# Patient Record
Sex: Male | Born: 1970 | Race: White | Hispanic: No | Smoking: Never smoker
Health system: Southern US, Community
[De-identification: ages and names within clinical notes are randomized; demographics above are authoritative.]

## PROBLEM LIST (undated history)

## (undated) DIAGNOSIS — I10 Essential (primary) hypertension: Secondary | ICD-10-CM

## (undated) DIAGNOSIS — E785 Hyperlipidemia, unspecified: Secondary | ICD-10-CM

## (undated) HISTORY — DX: Essential (primary) hypertension: I10

## (undated) HISTORY — PX: NASAL SINUS SURGERY: SHX719

## (undated) HISTORY — DX: Hyperlipidemia, unspecified: E78.5

---

## 2001-09-29 ENCOUNTER — Encounter: Payer: Self-pay | Admitting: Cardiovascular Disease

## 2006-04-12 ENCOUNTER — Encounter: Payer: Self-pay | Admitting: Cardiovascular Disease

## 2006-07-17 ENCOUNTER — Ambulatory Visit: Payer: Self-pay | Admitting: Internal Medicine

## 2008-01-14 ENCOUNTER — Encounter: Payer: Self-pay | Admitting: Cardiovascular Disease

## 2008-11-10 ENCOUNTER — Emergency Department: Payer: Self-pay

## 2009-06-24 ENCOUNTER — Ambulatory Visit: Payer: Self-pay | Admitting: Unknown Physician Specialty

## 2009-08-05 ENCOUNTER — Ambulatory Visit: Payer: Self-pay | Admitting: Cardiovascular Disease

## 2009-08-07 DIAGNOSIS — I10 Essential (primary) hypertension: Secondary | ICD-10-CM

## 2009-08-10 ENCOUNTER — Ambulatory Visit: Payer: Self-pay | Admitting: Cardiovascular Disease

## 2009-08-10 DIAGNOSIS — E785 Hyperlipidemia, unspecified: Secondary | ICD-10-CM | POA: Insufficient documentation

## 2009-08-17 LAB — CONVERTED CEMR LAB
HDL: 39 mg/dL — ABNORMAL LOW (ref >39)
Total CHOL/HDL Ratio: 6.3
Triglycerides: 200 mg/dL — ABNORMAL HIGH (ref <150)
VLDL: 40 mg/dL (ref 0–40)

## 2010-01-10 ENCOUNTER — Ambulatory Visit: Payer: Self-pay | Admitting: Cardiovascular Disease

## 2010-01-10 DIAGNOSIS — I471 Supraventricular tachycardia: Secondary | ICD-10-CM

## 2010-05-03 NOTE — Assessment & Plan Note (Signed)
Summary: np6   CC:  ROV; Rx Refill.  History of Present Illness: [age]-year-old male known to me from [facility] with a history of obstructive sleep apnea, hypertension, moderate LVH, mildly dilated aortic root with history of hyperlipidemia who presents to reestablish care.  He states that overall he has been doing well. He is out of his blood pressure medications and needs a refill. He denies any significant shortness of breath or lightheadedness, no chest pain. He was changed from Diovan to lisinopril HCTZ on his last visit and states he has tolerated this well.  Current Medications (verified): 1)  Lisinopril-Hydrochlorothiazide 20-12.5 Mg Tabs (Lisinopril-Hydrochlorothiazide) .... Take 1 By Mouth Once Daily 2)  Fexofenadine Hcl 180 Mg Tabs (Fexofenadine Hcl) .... Take 1 By Mouth Once Daily 3)  Aspirin 81 Mg Tbec (Aspirin) .... Take One Tablet By Mouth Daily 4)  Oxycodone-Acetaminophen 5-325 Mg Tabs (Oxycodone-Acetaminophen) .... As Needed 5)  Fluticasone Propionate 50 Mcg/act Susp (Fluticasone Propionate) .... Once Daily 6)  [facility] Saline Nasal Spray 0.65 % Soln (Saline) .... As Needed 7)  Ibuprofen 200 Mg Tabs (Ibuprofen) .... Take 1-2 By Mouth As Needed  Allergies (verified): No Known Drug Allergies  Review of Systems  The patient denies fever, weight loss, weight gain, vision loss, decreased hearing, hoarseness, chest pain, syncope, dyspnea on exertion, peripheral edema, prolonged cough, abdominal pain, incontinence, muscle weakness, depression, and enlarged lymph nodes.    Vital Signs:  Patient profile:   40 year old male Height:      71 inches Weight:      263 pounds BMI:     36.81 Pulse rate:   80 / minute BP sitting:   100 / 74  (left arm) Cuff size:   regular  Vitals Entered By: Stanton Kidney, EMT-P (Aug 05, 2009 2:10 PM)  Physical Exam  General:  Well-appearing gentleman in no apparent distress, HEENT exam is benign, or pharynx is clear, neck  is supple with no JVP or carotid bruits, heart sounds are regular with S1-S2 and no murmurs appreciated, lungs are clear to auscultation with no wheezes or rales, abdominal exam is benign, no significant lower extremity edema, neurologic exam is nonfocal, skin is warm and dry. Pulses are equal and symmetrical in his upper and lower extremities.    EKG  Procedure date:  08/05/2009  Findings:      normal sinus rhythm with rate of 80 beats per minute, no significant ST T-wave changes. Incomplete right bundle branch block.  Impression & Recommendations:  Problem # 1:  HYPERTENSION, BENIGN (ICD-401.1) blood pressure has been well-controlled on his current medication regimen and her lisinopril HCT 20/12.5 mg daily. We have given him some refills for the year.  He does have a history of obstructive sleep apnea, moderate LVH, mildly dilated aortic root. We can check his cardiac pathology including the aortic root with a routine echocardiogram in several years time.  His updated medication list for this problem includes:    Lisinopril-hydrochlorothiazide 20-12.5 Mg Tabs (Lisinopril-hydrochlorothiazide) .Marland Kitchen... Take 1 by mouth once daily    Aspirin 81 Mg Tbec (Aspirin) .Marland Kitchen... Take one tablet by mouth daily  Problem # 2:  PREVENTIVE HEALTH CARE (ICD-V70.0) We'll check a cholesterol in the next week. He believes his lipid numbers have been very elevated in the past.  He does have a very strong family history of coronary artery disease.  Patient Instructions: 1)  Your physician recommends that you schedule a follow-up appointment in: 1 year  2)  Your physician recommends that you return for a FASTING lipid profile: in 1 week (lipid)

## 2010-05-03 NOTE — Letter (Signed)
Summary: [facility]  [facility]   Imported By: Harlon Flor 08/08/2009 15:32:56  _____________________________________________________________________  External Attachment:    Type:   Image     Comment:   External Document

## 2010-05-03 NOTE — Letter (Signed)
Summary: Medical Record Release  Medical Record Release   Imported By: Harlon Flor 08/10/2009 14:47:54  _____________________________________________________________________  External Attachment:    Type:   Image     Comment:   External Document

## 2010-05-03 NOTE — Assessment & Plan Note (Signed)
Summary: Fluttering in chest & dizziness   Visit Type:  Follow-up Primary Provider:  None  CC:  c/o fluttering in chest with some dizziness and nausea and chest pain.Marland Kitchen  History of Present Illness: [age]-year-old male  with a history of obstructive sleep apnea, hypertension, moderate LVH, mildly dilated aortic root with history of hyperlipidemia who presents for follow up and with new symptoms of tachycardia.   he reports having acute onset of fluttering in his chest one week ago lasting for 30 seconds. It was very rapid associated with some lightheadedness. It occurred in the early evening.  He had a second episode the next day lasting for 20-30 seconds associated with dizziness and nausea. This occurred in the morning.  He has had no further episodes since that time. 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 He denies any other ailments or new symptoms and has been in good health.  EKG shows normal sinus rhythm with rate 79 beats per minute, no significant ST or T wave changes.  Preventive Screening-Counseling & Management  Alcohol-Tobacco     Smoking Status: never  Caffeine-Diet-Exercise     Does Patient Exercise: no      Drug Use:  no.    Current Medications (verified): 1)  Lisinopril-Hydrochlorothiazide 20-12.5 Mg Tabs (Lisinopril-Hydrochlorothiazide) .... Take 1 By Mouth Once Daily 2)  Fexofenadine Hcl 180 Mg Tabs (Fexofenadine Hcl) .... Take 1 By Mouth Once Daily 3)  Aspirin 81 Mg Tbec (Aspirin) .... Take One Tablet By Mouth Daily 4)  Oxycodone-Acetaminophen 5-325 Mg Tabs (Oxycodone-Acetaminophen) .... As Needed 5)  Fluticasone Propionate 50 Mcg/act Susp (Fluticasone Propionate) .... Once Daily 6)  [facility] Saline Nasal Spray 0.65 % Soln (Saline) .... As Needed 7)  Ibuprofen 200 Mg Tabs (Ibuprofen) .... Take 1-2 By Mouth As Needed 8)  Simvastatin 20 Mg Tabs (Simvastatin) .... Take One Tablet By Mouth Daily At Bedtime  Allergies (verified): No Known Drug Allergies  Past  History:  Family History: Last updated: Jan 28, 2010 Father: Deceased MI age 102 Mother: Living; CAD, Diabetic, Hypertension, hyperlipidemia Siblings: Good health  Social History: Last updated: 28-Jan-2010 Single  Full Time Tobacco Use - No.  Alcohol Use - yes--occas. Regular Exercise - no Drug Use - no  Risk Factors: Exercise: no (01-28-2010)  Risk Factors: Smoking Status: never (Jan 28, 2010)  Past Medical History: Hyperlipidemia Hypertension  Past Surgical History: Sinus surgery  Family History: Father: Deceased MI age 102 Mother: Living; CAD, Diabetic, Hypertension, hyperlipidemia Siblings: Good health  Social History: Single  Full Time Tobacco Use - No.  Alcohol Use - yes--occas. Regular Exercise - no Drug Use - no Smoking Status:  never Does Patient Exercise:  no Drug Use:  no  Review of Systems  The patient denies fever, weight loss, weight gain, vision loss, decreased hearing, hoarseness, chest pain, syncope, dyspnea on exertion, peripheral edema, prolonged cough, abdominal pain, incontinence, muscle weakness, depression, and enlarged lymph nodes.         Tachycardia, nausea  Vital Signs:  Patient profile:   40 year old male Height:      71 inches Weight:      266 pounds BMI:     37.23 Pulse rate:   89 / minute BP sitting:   120 / 84  (left arm) Cuff size:   large  Vitals Entered By: Bishop Dublin, CMA (01/28/2010 11:12 AM)  Physical Exam  General:  Well developed, well nourished, in no acute distress. Head:  normocephalic and atraumatic Neck:  Neck supple, no  JVD. No masses, thyromegaly or abnormal cervical nodes. Lungs:  Clear bilaterally to auscultation and percussion. Heart:  Non-displaced PMI, chest non-tender; regular rate and rhythm, S1, S2 without murmurs, rubs or gallops. Carotid upstroke normal, no bruit. NPedals normal pulses. No edema, no varicosities. Msk:  Back normal, normal gait. Muscle strength and tone normal. Pulses:   pulses normal in all 4 extremities Extremities:  No clubbing or cyanosis. Neurologic:  Alert and oriented x 3. Skin:  Intact without lesions or rashes. Psych:  Normal affect.   Impression & Recommendations:  Problem # 1:  TACHYCARDIA (ICD-785) 2 episodes of tachycardia last week that were self-limiting. This is concerning for SVT or an atrial tachycardia. We have discussed this with him and will make a medication change. We will hold his lisinopril and start him on diltiazem CD 120 mg daily. We will have him monitor his blood pressure and heart rate. If he has additional episodes, we could try a Holter monitor though I suspect his symptoms are infrequent enough that this may come up negative. He is [employer] which typically does not cover event monitors.  For additional episodes, we could add low dose beta blocker  We could also increase the dose of his diltiazem.  Problem # 2:  HYPERLIPIDEMIA (ICD-272.4) we'll have him check his cholesterol sometime this week or next week and adjust his medications accordingly.  His updated medication list for this problem includes:    Simvastatin 20 Mg Tabs (Simvastatin) .Marland Kitchen... Take one tablet by mouth daily at bedtime  Problem # 3:  HYPERTENSION, BENIGN (ICD-401.1) We'll have him monitor his blood pressure on diltiazem.  The following medications were removed from the medication list:    Lisinopril-hydrochlorothiazide 20-12.5 Mg Tabs (Lisinopril-hydrochlorothiazide) .Marland Kitchen... Take 1 by mouth once daily His updated medication list for this problem includes:    Aspirin 81 Mg Tbec (Aspirin) .Marland Kitchen... Take one tablet by mouth daily    Diltiazem Hcl Er Beads 120 Mg Xr24h-cap (Diltiazem hcl er beads) .Marland Kitchen... Take one capsule by mouth daily  Patient Instructions: 1)  Your physician recommends that you return for a FASTING lipid profile: LIP/LFT 2)  Your physician has recommended you make the following change in your medication: STOP lisinopril  START diltiazem 3)  Your physician wants you to follow-up in:  6 months  You will receive a reminder letter in the mail two months in advance. If you don't receive a letter, please call our office to schedule the follow-up appointment. Prescriptions: DILTIAZEM HCL ER BEADS 120 MG XR24H-CAP (DILTIAZEM HCL ER BEADS) Take one capsule by mouth daily  #30 x 6   Entered by:   Benedict Needy, RN   Authorized by:   Dossie Arbour MD   Signed by:   Benedict Needy, RN on 01/10/2010   Method used:   [facility] to        [facility] [phone]* (retail)       [address]       [place], [place]  [address]       Ph: [phone]       Fax: [phone]   RxID:   [id number]

## 2010-05-03 NOTE — Letter (Signed)
Summary: PHI  PHI   Imported By: Harlon Flor 08/08/2009 15:35:15  _____________________________________________________________________  External Attachment:    Type:   Image     Comment:   External Document

## 2010-08-15 ENCOUNTER — Other Ambulatory Visit: Payer: Self-pay | Admitting: Emergency Medicine

## 2010-08-15 MED ORDER — SIMVASTATIN 20 MG PO TABS: 20.0000 mg | ORAL_TABLET | Freq: Every day | ORAL | Status: DC

## 2010-08-29 ENCOUNTER — Other Ambulatory Visit: Payer: Self-pay | Admitting: Emergency Medicine

## 2010-08-29 MED ORDER — SIMVASTATIN 20 MG PO TABS: 20.0000 mg | ORAL_TABLET | Freq: Every day | ORAL | Status: DC

## 2010-09-07 ENCOUNTER — Encounter: Payer: Self-pay | Admitting: Cardiovascular Disease

## 2010-09-07 ENCOUNTER — Ambulatory Visit (INDEPENDENT_AMBULATORY_CARE_PROVIDER_SITE_OTHER): Payer: BC Managed Care – PPO | Admitting: Cardiovascular Disease

## 2010-09-07 DIAGNOSIS — E785 Hyperlipidemia, unspecified: Secondary | ICD-10-CM

## 2010-09-07 DIAGNOSIS — I10 Essential (primary) hypertension: Secondary | ICD-10-CM

## 2010-09-07 DIAGNOSIS — R0989 Other specified symptoms and signs involving the circulatory and respiratory systems: Secondary | ICD-10-CM

## 2010-09-07 MED ORDER — DILTIAZEM HCL 120 MG PO CP24: 120.0000 mg | ORAL_CAPSULE | Freq: Every day | ORAL | Status: DC

## 2010-09-07 NOTE — Patient Instructions (Signed)
Your physician recommends that you return for a FASTING lipid profile: 09/11/10 @ 8:30AM Your physician recommends that you schedule a follow-up appointment in: 1 year

## 2010-09-07 NOTE — Assessment & Plan Note (Signed)
Blood pressure is well-controlled on a diltiazem. No changes made.

## 2010-09-07 NOTE — Assessment & Plan Note (Signed)
We have suggested he check his cholesterol at his convenience. If he needs further adjustment, we could start Lipitor 40 mg for more aggressive control.

## 2010-09-07 NOTE — Progress Notes (Signed)
   Patient ID: Donald Holder, male    DOB: [birth year], 40 y.o.   MRN: [id number]  HPI Comments: 40 year old male  with a history of obstructive sleep apnea, hypertension, moderate LVH, mildly dilated aortic root with history of hyperlipidemia, With the evaluation last year for what sounded like SVT, started on diltiazem for now presents for follow up   Since he has been on a diltiazem 120 mg long-acting dose he has felt much better. He has had rare episodes of palpitations though these are very short, come on at night time. He denies any significant palpitations during the daytime. He has been taking his cholesterol medication on a regular basis.  He has had no further episodes since that time. 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 He denies any other ailments or new symptoms and has been in good health.   EKG shows normal sinus rhythm with rate 86 beats per minute, no significant ST or T wave changes.      Review of Systems  Constitutional: Negative.   HENT: Negative.   Eyes: Negative.   Respiratory: Negative.   Cardiovascular: Positive for palpitations.  Gastrointestinal: Negative.   Musculoskeletal: Negative.   Skin: Negative.   Neurological: Negative.   Hematological: Negative.   Psychiatric/Behavioral: Negative.   All other systems reviewed and are negative.    BP 118/68  Pulse 86  Ht 5\' 11"  (1.803 m)  Wt 257 lb 1.9 oz (116.629 kg)  BMI 35.86 kg/m2   Physical Exam  Nursing note and vitals reviewed. Constitutional: He is oriented to person, place, and time. He appears well-developed and well-nourished.  HENT:  Head: Normocephalic.  Nose: Nose normal.  Mouth/Throat: Oropharynx is clear and moist.  Eyes: Conjunctivae are normal. Pupils are equal, round, and reactive to light.  Neck: Normal range of motion. Neck supple. No JVD present.  Cardiovascular: Normal rate, regular rhythm, S1 normal, S2 normal, normal heart sounds and intact distal pulses.  Exam  reveals no gallop and no friction rub.   No murmur heard. Pulmonary/Chest: Effort normal and breath sounds normal. No respiratory distress. He has no wheezes. He has no rales. He exhibits no tenderness.  Abdominal: Soft. Bowel sounds are normal. He exhibits no distension. There is no tenderness.  Musculoskeletal: Normal range of motion. He exhibits no edema and no tenderness.  Lymphadenopathy:    He has no cervical adenopathy.  Neurological: He is alert and oriented to person, place, and time. Coordination normal.  Skin: Skin is warm and dry. No rash noted. No erythema.  Psychiatric: He has a normal mood and affect. His behavior is normal. Judgment and thought content normal.           Assessment and Plan

## 2010-09-07 NOTE — Assessment & Plan Note (Signed)
No further episodes of tachycardia, believed to be SVT. Doing well on diltiazem. We did offer short-acting diltiazem for breakthrough. He does not believe that he needs it at this time.

## 2010-09-11 ENCOUNTER — Other Ambulatory Visit: Payer: BC Managed Care – PPO | Admitting: *Deleted

## 2010-09-18 ENCOUNTER — Other Ambulatory Visit (INDEPENDENT_AMBULATORY_CARE_PROVIDER_SITE_OTHER): Payer: BC Managed Care – PPO | Admitting: *Deleted

## 2010-09-18 DIAGNOSIS — E785 Hyperlipidemia, unspecified: Secondary | ICD-10-CM

## 2010-09-18 LAB — HEPATIC FUNCTION PANEL
AST: 25 U/L (ref 0–37)
Albumin: 4.3 g/dL (ref 3.5–5.2)
Alkaline Phosphatase: 70 U/L (ref 39–117)
Bilirubin, Direct: 0.1 mg/dL (ref 0.0–0.3)
Total Bilirubin: 0.6 mg/dL (ref 0.3–1.2)

## 2010-09-18 LAB — LIPID PANEL: LDL Cholesterol: 74 mg/dL (ref 0–99)

## 2010-10-04 ENCOUNTER — Encounter: Payer: Self-pay | Admitting: Cardiovascular Disease

## 2010-10-05 ENCOUNTER — Other Ambulatory Visit: Payer: Self-pay | Admitting: Cardiovascular Disease

## 2010-10-05 MED ORDER — SIMVASTATIN 20 MG PO TABS: 20.0000 mg | ORAL_TABLET | Freq: Every day | ORAL | Status: DC

## 2010-11-20 ENCOUNTER — Ambulatory Visit: Payer: Self-pay

## 2010-11-24 ENCOUNTER — Ambulatory Visit: Payer: Self-pay | Admitting: Internal Medicine

## 2011-10-26 ENCOUNTER — Encounter: Payer: Self-pay | Admitting: Cardiovascular Disease

## 2011-10-26 ENCOUNTER — Ambulatory Visit (INDEPENDENT_AMBULATORY_CARE_PROVIDER_SITE_OTHER): Payer: BC Managed Care – PPO | Admitting: Cardiovascular Disease

## 2011-10-26 VITALS — BP 124/94 | HR 78 | Ht 71.0 in | Wt 259.5 lb

## 2011-10-26 DIAGNOSIS — I499 Cardiac arrhythmia, unspecified: Secondary | ICD-10-CM

## 2011-10-26 DIAGNOSIS — E785 Hyperlipidemia, unspecified: Secondary | ICD-10-CM

## 2011-10-26 DIAGNOSIS — I471 Supraventricular tachycardia, unspecified: Secondary | ICD-10-CM

## 2011-10-26 DIAGNOSIS — I10 Essential (primary) hypertension: Secondary | ICD-10-CM

## 2011-10-26 DIAGNOSIS — I498 Other specified cardiac arrhythmias: Secondary | ICD-10-CM

## 2011-10-26 MED ORDER — DILTIAZEM HCL ER 120 MG PO CP24: 120.0000 mg | ORAL_CAPSULE | Freq: Every day | ORAL | Status: DC

## 2011-10-26 MED ORDER — SIMVASTATIN 20 MG PO TABS: 20.0000 mg | ORAL_TABLET | Freq: Every day | ORAL | Status: DC

## 2011-10-26 NOTE — Progress Notes (Signed)
Patient ID: Donald Holder, male    DOB: September 28, 1970, 41 y.o.   MRN: [id number]  HPI Comments: 41 year old male  with a history of obstructive sleep apnea, hypertension, moderate LVH, mildly dilated aortic root with history of hyperlipidemia, With the evaluation last year for what sounded like SVT, started on diltiazem, presenting for follow up.  He was previously doing well on diltiazem 120 mg daily. He reports that his blood pressure was mildly elevated when he saw Dr. Beverely Risen. The diltiazem dose was increased to 180 mg daily. He was told to stop aspirin. Since the medication changes were made, he has not felt as well. He has more fatigue. He reports that his blood pressure prior to the medication change was in the 120 range systolic, mid 80s or lower diastolic. He reports it has been a stressful period for him at work and blood pressure could be higher secondary to this. He is requesting that we decrease the dose of diltiazem back to 120 mg daily.  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 He denies any other ailments or new symptoms and has been in good health.   EKG shows normal sinus rhythm with rate 78 beats per minute, no significant ST or T wave changes.    Outpatient Encounter Prescriptions as of 10/26/2011  Medication Sig Dispense Refill  . diltiazem (DILACOR XR) 120 MG 24 hr capsule Take 1 capsule (120 mg total) by mouth daily.  90 capsule  3  . fexofenadine (ALLEGRA) 180 MG tablet Take 180 mg by mouth daily.        . fluticasone (FLONASE) 50 MCG/ACT nasal spray Place 2 sprays into the nose daily.        Marland Kitchen ibuprofen (ADVIL,MOTRIN) 200 MG tablet Take 200 mg by mouth every 6 (six) hours as needed.        . simvastatin (ZOCOR) 20 MG tablet Take 1 tablet (20 mg total) by mouth at bedtime.  90 tablet  4  . diltiazem (DILACOR XR) 180 MG 24 hr capsule Take 180 mg by mouth daily.      Marland Kitchen DISCONTD: aspirin 81 MG EC tablet Take 81 mg by mouth daily.         Marland Kitchen DISCONTD: diltiazem (DILACOR XR) 120 MG 24 hr capsule Take 1 capsule (120 mg total) by mouth daily.  90 capsule  4    Review of Systems  Constitutional: Negative.   HENT: Negative.   Eyes: Negative.   Respiratory: Negative.   Gastrointestinal: Negative.   Musculoskeletal: Negative.   Skin: Negative.   Neurological: Negative.   Hematological: Negative.   Psychiatric/Behavioral: Negative.   All other systems reviewed and are negative.    BP 124/94  Pulse 78  Ht 5\' 11"  (1.803 m)  Wt 259 lb 8 oz (117.708 kg)  BMI 36.19 kg/m2  Physical Exam  Nursing note and vitals reviewed. Constitutional: He is oriented to person, place, and time. He appears well-developed and well-nourished.  HENT:  Head: Normocephalic.  Nose: Nose normal.  Mouth/Throat: Oropharynx is clear and moist.  Eyes: Conjunctivae are normal. Pupils are equal, round, and reactive to light.  Neck: Normal range of motion. Neck supple. No JVD present.  Cardiovascular: Normal rate, regular rhythm, S1 normal, S2 normal, normal heart sounds and intact distal pulses.  Exam reveals no gallop and no friction rub.   No murmur heard. Pulmonary/Chest: Effort normal and breath sounds normal. No respiratory distress. He has no wheezes. He  has no rales. He exhibits no tenderness.  Abdominal: Soft. Bowel sounds are normal. He exhibits no distension. There is no tenderness.  Musculoskeletal: Normal range of motion. He exhibits no edema and no tenderness.  Lymphadenopathy:    He has no cervical adenopathy.  Neurological: He is alert and oriented to person, place, and time. Coordination normal.  Skin: Skin is warm and dry. No rash noted. No erythema.  Psychiatric: He has a normal mood and affect. His behavior is normal. Judgment and thought content normal.           Assessment and Plan

## 2011-10-26 NOTE — Assessment & Plan Note (Signed)
Cholesterol is at goal on the current lipid regimen. No changes to the medications were made.  

## 2011-10-26 NOTE — Assessment & Plan Note (Signed)
No further episodes of tachycardia. No further workup at this time 

## 2011-10-26 NOTE — Assessment & Plan Note (Signed)
He has requested to decrease the diltiazem back to 120 mg daily. We will do this for him and have suggested he monitor his blood pressure at home and call our office for diastolic pressures consistently in the 90s. He does report having pressures in the 80s.

## 2011-10-26 NOTE — Patient Instructions (Addendum)
You are doing well. Please change the diltiazem back to 120 mg daily OK to take aspirin 81 mg once a day  Please call us if you have new issues that need to be addressed before your next appt.  Your physician wants you to follow-up in: 12 months.  You will receive a reminder letter in the mail two months in advance. If you don't receive a letter, please call our office to schedule the follow-up appointment.

## 2011-10-29 ENCOUNTER — Telehealth: Payer: Self-pay | Admitting: Cardiovascular Disease

## 2011-10-29 NOTE — Telephone Encounter (Signed)
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  The letter needs to go to [facility] fax number : [phone].  Please call the mother Velna Hatchet at [phone] when this is completed per her request.

## 2011-10-30 NOTE — Telephone Encounter (Signed)
Discussed with Dr. 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

## 2011-10-30 NOTE — Telephone Encounter (Signed)
Pt's mother, shelia, informed

## 2012-11-14 ENCOUNTER — Telehealth: Payer: Self-pay | Admitting: *Deleted

## 2012-11-14 NOTE — Telephone Encounter (Signed)
Lmom to schedule f/u appt w/ Dr. Mariah Milling

## 2012-11-20 ENCOUNTER — Other Ambulatory Visit: Payer: Self-pay | Admitting: Cardiovascular Disease

## 2012-11-28 ENCOUNTER — Other Ambulatory Visit: Payer: Self-pay | Admitting: *Deleted

## 2012-11-28 MED ORDER — DILTIAZEM HCL ER 120 MG PO CP24: 120.0000 mg | ORAL_CAPSULE | Freq: Every day | ORAL | Status: DC

## 2012-11-28 MED ORDER — SIMVASTATIN 20 MG PO TABS: 20.0000 mg | ORAL_TABLET | Freq: Every day | ORAL | Status: DC

## 2012-11-28 NOTE — Telephone Encounter (Signed)
Refilled Diltiazem #90 R#0.

## 2012-11-28 NOTE — Telephone Encounter (Signed)
[facility] Simvastatin #90 R#0. Lmtcb pt is overdue for 1 yr f/u needs to contact office to schedule future appointment.

## 2013-02-28 ENCOUNTER — Other Ambulatory Visit: Payer: Self-pay | Admitting: Cardiovascular Disease

## 2013-03-02 ENCOUNTER — Other Ambulatory Visit: Payer: Self-pay | Admitting: *Deleted

## 2013-03-02 MED ORDER — SIMVASTATIN 20 MG PO TABS: 20.0000 mg | ORAL_TABLET | Freq: Every day | ORAL | Status: DC

## 2013-03-02 NOTE — Telephone Encounter (Signed)
Requested Prescriptions   Signed Prescriptions Disp Refills  . simvastatin (ZOCOR) 20 MG tablet 30 tablet 0    Sig: Take 1 tablet (20 mg total) by mouth at bedtime.    Authorizing Provider: Antonieta Iba    Ordering User: Kendrick Fries   LMTCB pt is overdue for 1 yr follow up needs to schedule appointment with Dr. Mariah Milling.

## 2013-03-23 ENCOUNTER — Ambulatory Visit: Payer: BC Managed Care – PPO | Admitting: Cardiovascular Disease

## 2013-03-24 ENCOUNTER — Encounter: Payer: Self-pay | Admitting: Cardiovascular Disease

## 2013-03-24 ENCOUNTER — Ambulatory Visit (INDEPENDENT_AMBULATORY_CARE_PROVIDER_SITE_OTHER): Payer: BC Managed Care – PPO | Admitting: Cardiovascular Disease

## 2013-03-24 VITALS — BP 122/92 | HR 71 | Ht 71.0 in | Wt 274.0 lb

## 2013-03-24 DIAGNOSIS — I10 Essential (primary) hypertension: Secondary | ICD-10-CM

## 2013-03-24 DIAGNOSIS — R0602 Shortness of breath: Secondary | ICD-10-CM

## 2013-03-24 DIAGNOSIS — Z79899 Other long term (current) drug therapy: Secondary | ICD-10-CM

## 2013-03-24 DIAGNOSIS — E785 Hyperlipidemia, unspecified: Secondary | ICD-10-CM

## 2013-03-24 DIAGNOSIS — I498 Other specified cardiac arrhythmias: Secondary | ICD-10-CM

## 2013-03-24 DIAGNOSIS — I471 Supraventricular tachycardia: Secondary | ICD-10-CM

## 2013-03-24 MED ORDER — SIMVASTATIN 20 MG PO TABS: 20.0000 mg | ORAL_TABLET | Freq: Every day | ORAL | Status: DC

## 2013-03-24 MED ORDER — DILTIAZEM HCL ER 120 MG PO CP24: 120.0000 mg | ORAL_CAPSULE | Freq: Every day | ORAL | Status: DC

## 2013-03-24 NOTE — Patient Instructions (Signed)
You are doing well. No medication changes were made.  We will draw your blood today and call you with results  Please call us if you have new issues that need to be addressed before your next appt.  Your physician wants you to follow-up in: 12 months.  You will receive a reminder letter in the mail two months in advance. If you don't receive a letter, please call our office to schedule the follow-up appointment.

## 2013-03-24 NOTE — Assessment & Plan Note (Signed)
No further episodes of arrhythmia. Stay on diltiazem

## 2013-03-24 NOTE — Progress Notes (Signed)
Patient ID: Donald Holder, male    DOB: 1970-07-12, 42 y.o.   MRN: [id number]  HPI Comments: 80 -year-old male  with a history of obstructive sleep apnea, hypertension, moderate LVH, mildly dilated aortic root with history of hyperlipidemia, and previous tachycardia episodes concerning for SVT, started on diltiazem .  On his last clinic visit, he was changed to diltiazem 120 mg daily. On this dose, blood pressure has been well controlled with no further episodes of tachycardia . 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 He denies any other ailments or new symptoms and has been in good health. [place] [address] [place]    EKG shows normal sinus rhythm with rate 71 beats per minute, no significant ST or T wave changes.    Outpatient Encounter Prescriptions as of 03/24/2013  Medication Sig  . diltiazem (DILACOR XR) 120 MG 24 hr capsule Take 1 capsule (120 mg total) by mouth daily.  . fexofenadine (ALLEGRA) 180 MG tablet Take 180 mg by mouth daily.    . fluticasone (FLONASE) 50 MCG/ACT nasal spray Place 2 sprays into the nose daily.    Marland Kitchen ibuprofen (ADVIL,MOTRIN) 200 MG tablet Take 200 mg by mouth every 6 (six) hours as needed.    . simvastatin (ZOCOR) 20 MG tablet Take 1 tablet (20 mg total) by mouth at bedtime.  . simvastatin (ZOCOR) 20 MG tablet Take 1 tablet (20 mg total) by mouth daily at 6 PM.  . [DISCONTINUED] diltiazem (DILACOR XR) 120 MG 24 hr capsule Take 120 mg by mouth daily.  . [DISCONTINUED] simvastatin (ZOCOR) 20 MG tablet TAKE 1 TABLET BY MOUTH AT BEDTIME  . [DISCONTINUED] diltiazem (DILACOR XR) 120 MG 24 hr capsule Take 1 capsule (120 mg total) by mouth daily.     Review of Systems  Constitutional: Negative.   HENT: Negative.   Eyes: Negative.   Respiratory: Negative.   Cardiovascular: Negative.   Gastrointestinal: Negative.   Endocrine: Negative.   Musculoskeletal: Negative.   Skin: Negative.   Allergic/Immunologic:  Negative.   Neurological: Negative.   Hematological: Negative.   Psychiatric/Behavioral: Negative.   All other systems reviewed and are negative.    BP 122/92  Pulse 71  Ht 5\' 11"  (1.803 m)  Wt 274 lb (124.286 kg)  BMI 38.23 kg/m2  Physical Exam  Nursing note and vitals reviewed. Constitutional: He is oriented to person, place, and time. He appears well-developed and well-nourished.  HENT:  Head: Normocephalic.  Nose: Nose normal.  Mouth/Throat: Oropharynx is clear and moist.  Eyes: Conjunctivae are normal. Pupils are equal, round, and reactive to light.  Neck: Normal range of motion. Neck supple. No JVD present.  Cardiovascular: Normal rate, regular rhythm, S1 normal, S2 normal, normal heart sounds and intact distal pulses.  Exam reveals no gallop and no friction rub.   No murmur heard. Pulmonary/Chest: Effort normal and breath sounds normal. No respiratory distress. He has no wheezes. He has no rales. He exhibits no tenderness.  Abdominal: Soft. Bowel sounds are normal. He exhibits no distension. There is no tenderness.  Musculoskeletal: Normal range of motion. He exhibits no edema and no tenderness.  Lymphadenopathy:    He has no cervical adenopathy.  Neurological: He is alert and oriented to person, place, and time. Coordination normal.  Skin: Skin is warm and dry. No rash noted. No erythema.  Psychiatric: He has a normal mood and affect. His behavior is normal. Judgment and thought content normal.  Assessment and Plan

## 2013-03-24 NOTE — Assessment & Plan Note (Addendum)
We will recheck his lipid panel today. Stay on simvastatin

## 2013-03-24 NOTE — Assessment & Plan Note (Signed)
Blood pressure is well controlled on today's visit. No changes made to the medications. 

## 2013-03-25 LAB — HEPATIC FUNCTION PANEL
ALT: 39 IU/L (ref 0–44)
AST: 29 IU/L (ref 0–40)
Total Bilirubin: 0.4 mg/dL (ref 0.0–1.2)
Total Protein: 6.9 g/dL (ref 6.0–8.5)

## 2013-03-25 LAB — LIPID PANEL
Chol/HDL Ratio: 4.2 ratio units (ref 0.0–5.0)
Cholesterol, Total: 177 mg/dL (ref 100–199)
LDL Calculated: 109 mg/dL — ABNORMAL HIGH (ref 0–99)
Triglycerides: 132 mg/dL (ref 0–149)
VLDL Cholesterol Cal: 26 mg/dL (ref 5–40)

## 2013-06-10 ENCOUNTER — Other Ambulatory Visit: Payer: Self-pay | Admitting: Cardiovascular Disease

## 2014-06-14 ENCOUNTER — Other Ambulatory Visit: Payer: Self-pay

## 2014-06-14 NOTE — Telephone Encounter (Signed)
Refill sent for simvastatin 20 mg  

## 2014-06-18 ENCOUNTER — Other Ambulatory Visit: Payer: Self-pay | Admitting: *Deleted

## 2014-06-18 MED ORDER — SIMVASTATIN 20 MG PO TABS: 20.0000 mg | ORAL_TABLET | Freq: Every day | ORAL | Status: DC

## 2014-06-18 NOTE — Telephone Encounter (Signed)
Rx(s) sent to pharmacy electronically.  

## 2014-10-12 ENCOUNTER — Encounter: Payer: Self-pay | Admitting: Cardiovascular Disease

## 2014-10-12 ENCOUNTER — Ambulatory Visit (INDEPENDENT_AMBULATORY_CARE_PROVIDER_SITE_OTHER): Payer: BC Managed Care – PPO | Admitting: Cardiovascular Disease

## 2014-10-12 VITALS — BP 128/90 | HR 81 | Ht 71.0 in | Wt 289.5 lb

## 2014-10-12 DIAGNOSIS — Z8249 Family history of ischemic heart disease and other diseases of the circulatory system: Secondary | ICD-10-CM | POA: Diagnosis not present

## 2014-10-12 DIAGNOSIS — I471 Supraventricular tachycardia: Secondary | ICD-10-CM

## 2014-10-12 DIAGNOSIS — I10 Essential (primary) hypertension: Secondary | ICD-10-CM | POA: Diagnosis not present

## 2014-10-12 DIAGNOSIS — E669 Obesity, unspecified: Secondary | ICD-10-CM

## 2014-10-12 DIAGNOSIS — E785 Hyperlipidemia, unspecified: Secondary | ICD-10-CM

## 2014-10-12 DIAGNOSIS — G4733 Obstructive sleep apnea (adult) (pediatric): Secondary | ICD-10-CM

## 2014-10-12 MED ORDER — SIMVASTATIN 20 MG PO TABS: 20.0000 mg | ORAL_TABLET | Freq: Every day | ORAL | Status: AC

## 2014-10-12 MED ORDER — DILTIAZEM HCL 30 MG PO TABS: 30.0000 mg | ORAL_TABLET | Freq: Three times a day (TID) | ORAL | Status: AC | PRN

## 2014-10-12 NOTE — Assessment & Plan Note (Signed)
Denies having any significant arrhythmia. Despite different stress, he has been feeling well. We have prescribed diltiazem 30 mg pills to take as needed for breakthrough tachycardia If he has recurrent symptoms on a regular basis, could restart the diltiazem 120 mg daily

## 2014-10-12 NOTE — Assessment & Plan Note (Signed)
Dietary changes and exercise program discussed with him. Weight is up 15 pounds from his prior clinic visit over one year ago. He attributes the weight to stress

## 2014-10-12 NOTE — Assessment & Plan Note (Signed)
He reports blood pressure stable at home. Diastolic borderline elevated today. Recommended he work on diet, exercise, close monitoring of his blood pressure Will not restart his diltiazem at this time as he's hoping to avoid regular medications

## 2014-10-12 NOTE — Patient Instructions (Addendum)
You are doing well.  Please restart simvastatin one a day for cholesterol Take diltiazem 30 mg tabs as needed for high blood pressure or tachycardia  You are scheduled for a coronary calcium score for family hx of CAD Thursday, July 14 @ 4:30, please arrive @ 4:15 There is a one-time fee of $150.00 due at the time of your procedure  Please call us if you have new issues that need to be addressed before your next appt.  Your physician wants you to follow-up in: 12 months.  You will receive a reminder letter in the mail two months in advance. If you don't receive a letter, please call our office to schedule the follow-up appointment.

## 2014-10-12 NOTE — Assessment & Plan Note (Signed)
Currently not on CPAP

## 2014-10-12 NOTE — Progress Notes (Signed)
Patient ID: Donald Holder, male    DOB: 1970/05/12, 44 y.o.   MRN: [id number]  HPI Comments: 44 -year-old male  with a history of obstructive sleep apnea, hypertension, moderate LVH, mildly dilated aortic root with history of hyperlipidemia, and previous tachycardia episodes concerning for SVT, started on diltiazem . He presents today for follow-up of his tachycardia  He been out of his medication several months ago. Since then he has felt relatively well. Denies having any long periods of tachycardia, possibly 2 very short episodes of arrhythmia lasting for seconds at a time. 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   [place] [address] [place]    EKG shows normal sinus rhythm with rate 81 beats per minute, no significant ST or T wave changes.  In terms of his family history, reports father had coronary artery disease at an early age, mother also with cardiac issues.   No Known Allergies  Current Outpatient Prescriptions on File Prior to Visit  Medication Sig Dispense Refill  . fexofenadine (ALLEGRA) 180 MG tablet Take 180 mg by mouth daily.      . fluticasone (FLONASE) 50 MCG/ACT nasal spray Place 2 sprays into the nose daily.      Marland Kitchen. ibuprofen (ADVIL,MOTRIN) 200 MG tablet Take 200 mg by mouth every 6 (six) hours as needed.       No current facility-administered medications on file prior to visit.    Past Medical History  Diagnosis Date  . Hyperlipidemia   . Hypertension     Past Surgical History  Procedure Laterality Date  . Nasal sinus surgery      Social History  reports that he has never smoked. He has never used smokeless tobacco. He reports that he drinks alcohol. He reports that he does not use illicit drugs.  Family History family history includes Coronary artery disease in his mother; Diabetes in  his mother; Heart attack in his father; Hyperlipidemia in his mother; Hypertension in his mother.    Review of Systems  Constitutional: Negative.   Respiratory: Negative.   Cardiovascular: Negative.   Gastrointestinal: Negative.   Musculoskeletal: Negative.   Skin: Negative.   Neurological: Negative.   Hematological: Negative.   Psychiatric/Behavioral: Negative.   All other systems reviewed and are negative.   BP 128/90 mmHg  Pulse 81  Ht 5\' 11"  (1.803 m)  Wt 289 lb 8 oz (131.316 kg)  BMI 40.39 kg/m2  Physical Exam  Constitutional: He is oriented to person, place, and time. He appears well-developed and well-nourished.   obese  HENT:  Head: Normocephalic.  Nose: Nose normal.  Mouth/Throat: Oropharynx is clear and moist.  Eyes: Conjunctivae are normal. Pupils are equal, round, and reactive to light.  Neck: Normal range of motion. Neck supple. No JVD present.  Cardiovascular: Normal rate, regular rhythm, S1 normal, S2 normal, normal heart sounds and intact distal pulses.  Exam reveals no gallop and no friction rub.   No murmur heard. Pulmonary/Chest: Effort normal and breath sounds normal. No respiratory distress. He has no wheezes. He has no rales. He exhibits no tenderness.  Abdominal: Soft. Bowel sounds are normal. He exhibits no distension. There is no tenderness.  Musculoskeletal: Normal range of motion. He exhibits no edema or tenderness.  Lymphadenopathy:    He has no cervical adenopathy.  Neurological: He is alert and oriented to person,  place, and time. Coordination normal.  Skin: Skin is warm and dry. No rash noted. No erythema.  Psychiatric: He has a normal mood and affect. His behavior is normal. Judgment and thought content normal.      Assessment and Plan   Nursing note and vitals reviewed.

## 2014-10-12 NOTE — Assessment & Plan Note (Signed)
Recommended he restart his simvastatin 20 mg daily. Risk stratification discussed with him. Coronary calcium score discussed with him given his strong family history. This will be ordered at [facility] request [place] [facility] [place] [facility] [place] [facility], [place] 14 2014 Further medication management based on his score

## 2014-10-14 ENCOUNTER — Inpatient Hospital Stay: Admission: RE | Admit: 2014-10-14 | Payer: BC Managed Care – PPO | Source: Ambulatory Visit

## 2014-10-15 ENCOUNTER — Telehealth: Payer: Self-pay

## 2014-10-15 NOTE — Telephone Encounter (Signed)
Left message for pt to call back, as he did not show [place] [facility] [place] [facility] [place].

## 2014-10-29 ENCOUNTER — Ambulatory Visit (INDEPENDENT_AMBULATORY_CARE_PROVIDER_SITE_OTHER)
Admission: RE | Admit: 2014-10-29 | Discharge: 2014-10-29 | Disposition: A | Discharge status: Home / Self Care | Payer: Self-pay | Source: Ambulatory Visit | Attending: Cardiovascular Disease | Admitting: Cardiovascular Disease

## 2014-10-29 DIAGNOSIS — I10 Essential (primary) hypertension: Secondary | ICD-10-CM

## 2014-10-29 DIAGNOSIS — Z8249 Family history of ischemic heart disease and other diseases of the circulatory system: Secondary | ICD-10-CM

## 2015-12-06 IMAGING — CT CT HEART SCORING
1 of 3 series · 10 of 20 positions shown, 13 images · non-contrast
Comparison: No priors.

CLINICAL DATA: Risk stratification

EXAM:
Coronary Calcium Score
TECHNIQUE: The patient was scanned on a Siemens Sensation 16 slice scanner.
Axial non-contrast 3mm slices were carried out through the heart.
The data set was analyzed on a dedicated work station and scored
using the Agatson method.

[Series 6: st thins for reformat · axial · 0.45mm/px · z∈[+993,+1126]mm · 10 of 163 slices shown, 13 images]
[im 15/163  vessel]
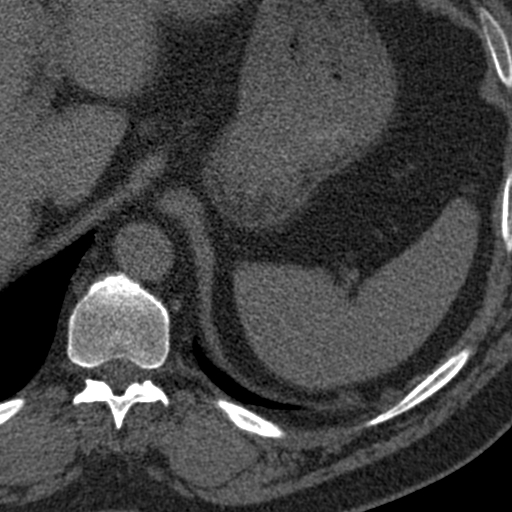
[im 15/163  lung]
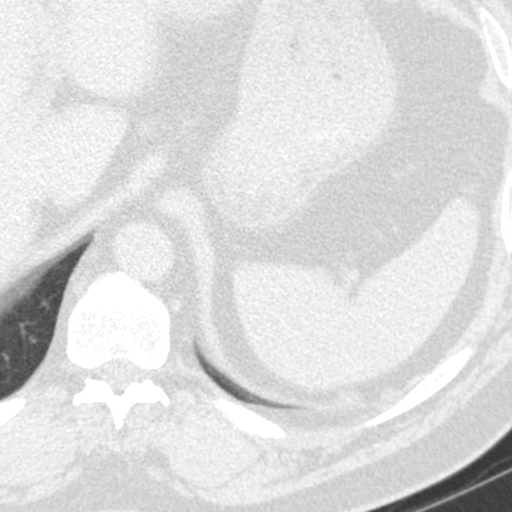
[im 30/163  vessel]
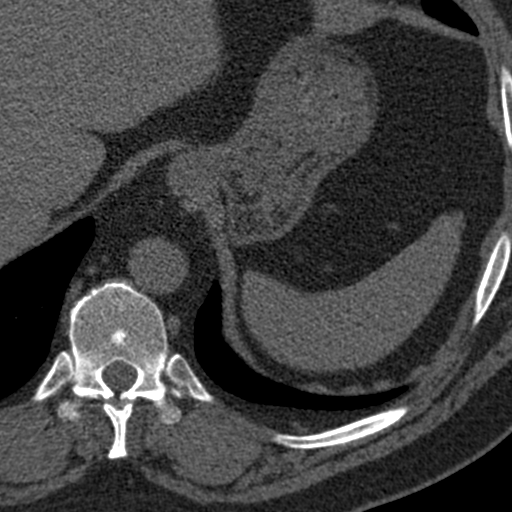
[im 45/163  vessel]
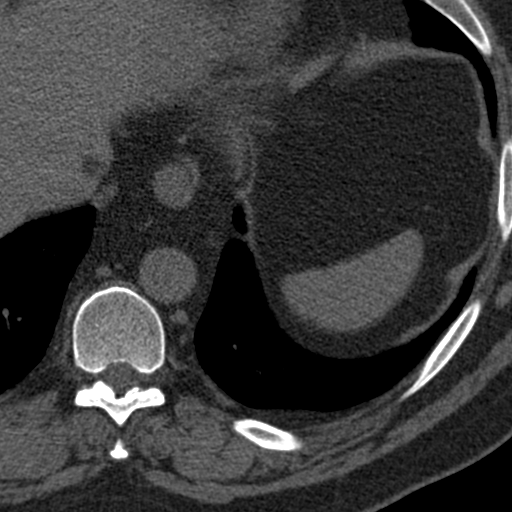
[im 59/163  vessel]
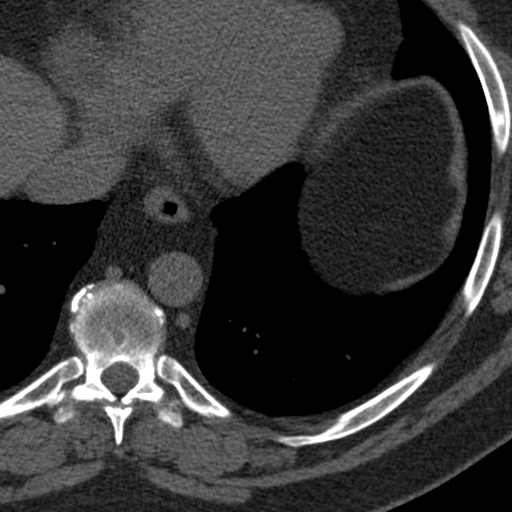
[im 74/163  vessel]
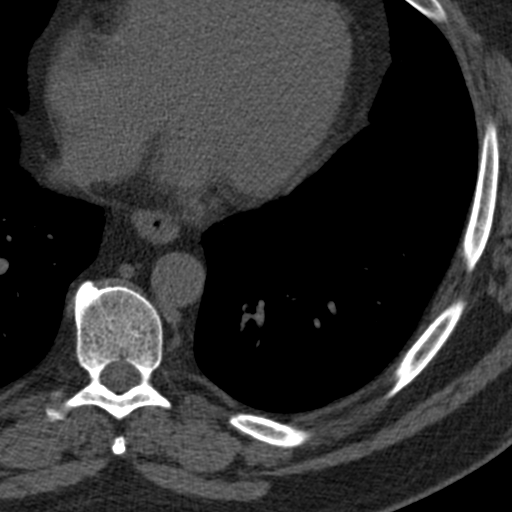
[im 74/163  lung]
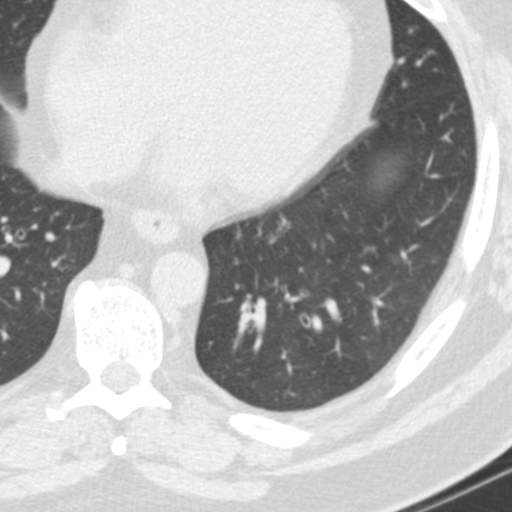
[im 89/163  vessel]
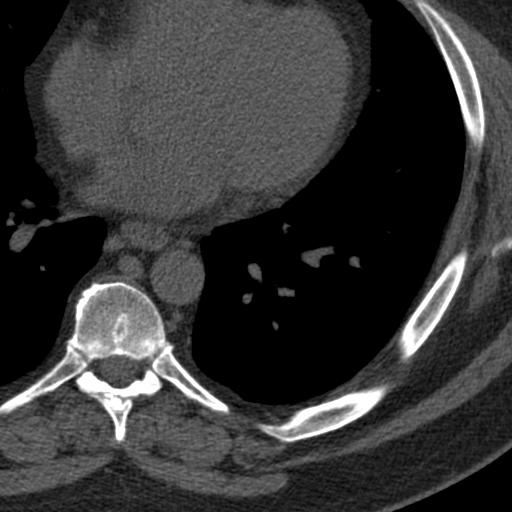
[im 104/163  vessel]
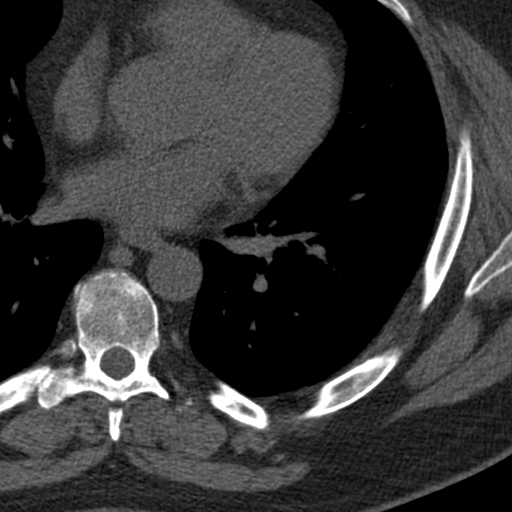
[im 118/163  vessel]
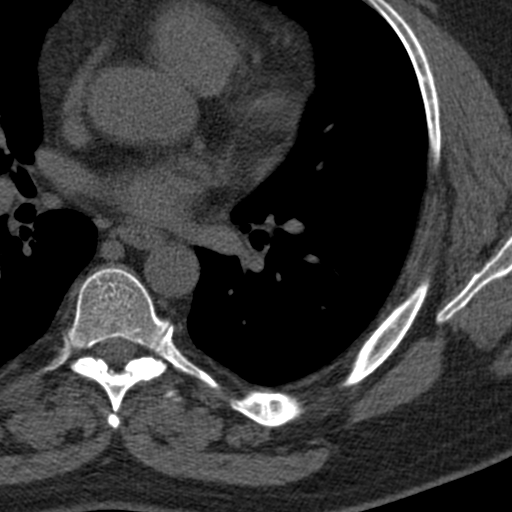
[im 133/163  vessel]
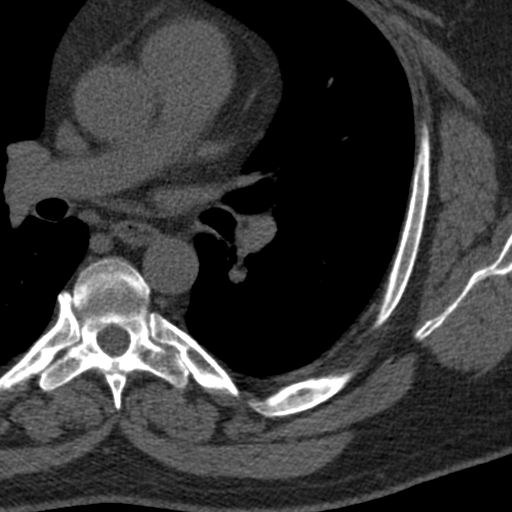
[im 133/163  lung]
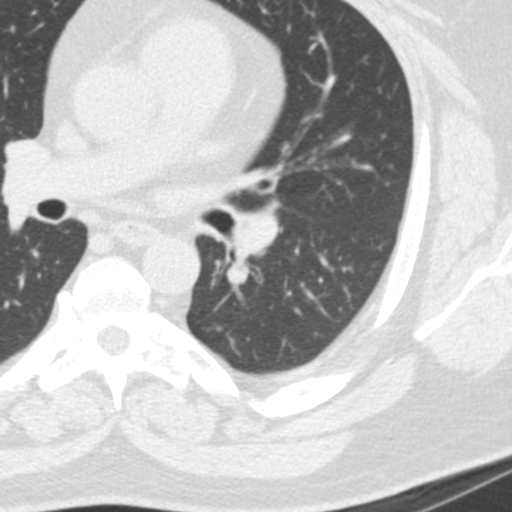
[im 148/163  vessel]
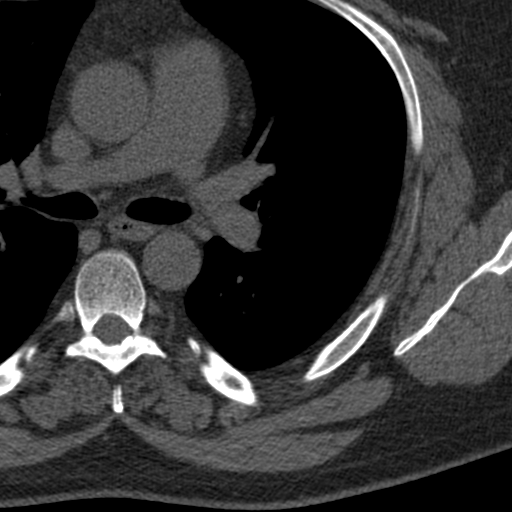

[10 of 20 positions shown; findings below may reference images not displayed]

FINDINGS: Non-cardiac: RLL lung nodules See separate report from [REDACTED].

Ascending Aorta:  3.6 cm

Pericardium: Normal

Coronary arteries:  No coronary artery calcium detected
IMPRESSION: Coronary calcium score of 0.

Barb Forster

EXAM:
OVER-READ INTERPRETATION  CT CHEST

The following report is an over-read performed by radiologist Dr.
over-read does not include interpretation of cardiac or coronary
anatomy or pathology. The coronary calcium score interpretation by
the cardiologist is attached.
FINDINGS: Mild diffuse bronchial wall thickening. Several tiny pulmonary
nodules in the right lower lobe, largest of which measures only 3 mm
(image 28 of series 4). Within the visualized portions of the thorax
there are no larger more suspicious appearing pulmonary nodules or
masses, there is no acute consolidative airspace disease, no pleural
effusions, no pneumothorax and no lymphadenopathy. Visualized
portions of the upper abdomen are unremarkable. There are no
aggressive appearing lytic or blastic lesions noted in the
visualized portions of the skeleton.
IMPRESSION: 1. Multiple tiny pulmonary nodules in the right lower lobe, largest
of which measures only 3 mm. These are nonspecific. If the patient
is at high risk for bronchogenic carcinoma, follow-up chest CT at 1
year is recommended. If the patient is at low risk, no follow-up is
needed. This recommendation follows the consensus statement:
Guidelines for Management of Small Pulmonary Nodules Detected on CT
Scans: A Statement from the [HOSPITAL] as published in
2. Mild diffuse bronchial wall thickening. This could indicate
reactive airway disease, chronic bronchitis, or in the appropriate
clinical setting could indicate mild acute bronchitis. Clinical
correlation is recommended.

## 2016-01-17 ENCOUNTER — Telehealth: Payer: Self-pay | Admitting: Cardiovascular Disease

## 2016-01-17 NOTE — Telephone Encounter (Signed)
3 attempts to schedule fu from recall list.  Deleting recall.  °
